# Patient Record
Sex: Female | Born: 1935 | Race: White | Hispanic: No | Marital: Married
Health system: Southern US, Community
[De-identification: ages and names within clinical notes are randomized; demographics above are authoritative.]

---

## 1999-10-10 ENCOUNTER — Encounter: Payer: Self-pay | Admitting: Neurosurgery

## 1999-10-12 ENCOUNTER — Encounter: Payer: Self-pay | Admitting: Neurosurgery

## 1999-10-12 ENCOUNTER — Inpatient Hospital Stay (HOSPITAL_COMMUNITY): Admission: RE | Admit: 1999-10-12 | Discharge: 1999-10-13 | Payer: Self-pay | Admitting: Neurosurgery

## 1999-11-14 ENCOUNTER — Encounter: Admission: RE | Admit: 1999-11-14 | Discharge: 1999-11-14 | Payer: Self-pay | Admitting: Neurosurgery

## 1999-11-14 ENCOUNTER — Encounter: Payer: Self-pay | Admitting: Neurosurgery

## 2003-11-16 ENCOUNTER — Ambulatory Visit: Payer: Self-pay | Admitting: Family Medicine

## 2003-11-20 ENCOUNTER — Other Ambulatory Visit: Payer: Self-pay

## 2003-11-20 ENCOUNTER — Inpatient Hospital Stay: Payer: Self-pay | Admitting: Unknown Physician Specialty

## 2003-11-25 ENCOUNTER — Ambulatory Visit: Payer: Self-pay | Admitting: Internal Medicine

## 2004-07-31 ENCOUNTER — Ambulatory Visit: Payer: Self-pay | Admitting: Family Medicine

## 2004-11-14 ENCOUNTER — Other Ambulatory Visit: Payer: Self-pay

## 2004-11-14 ENCOUNTER — Emergency Department: Payer: Self-pay | Admitting: Unknown Physician Specialty

## 2005-01-29 ENCOUNTER — Ambulatory Visit: Payer: Self-pay | Admitting: Urology

## 2005-02-06 ENCOUNTER — Ambulatory Visit: Payer: Self-pay | Admitting: Urology

## 2005-03-13 ENCOUNTER — Ambulatory Visit: Payer: Self-pay | Admitting: Gastroenterology

## 2005-04-18 ENCOUNTER — Ambulatory Visit: Payer: Self-pay | Admitting: Family Medicine

## 2005-04-22 ENCOUNTER — Ambulatory Visit: Payer: Self-pay | Admitting: Family Medicine

## 2005-05-22 ENCOUNTER — Ambulatory Visit: Payer: Self-pay | Admitting: Family Medicine

## 2005-06-22 ENCOUNTER — Ambulatory Visit: Payer: Self-pay | Admitting: Family Medicine

## 2005-10-09 ENCOUNTER — Ambulatory Visit: Payer: Self-pay | Admitting: Family Medicine

## 2006-04-24 ENCOUNTER — Observation Stay: Payer: Self-pay | Admitting: Internal Medicine

## 2006-04-24 ENCOUNTER — Other Ambulatory Visit: Payer: Self-pay

## 2006-10-16 ENCOUNTER — Ambulatory Visit: Payer: Self-pay | Admitting: Family Medicine

## 2007-07-20 ENCOUNTER — Ambulatory Visit: Payer: Self-pay | Admitting: Internal Medicine

## 2007-10-21 ENCOUNTER — Ambulatory Visit: Payer: Self-pay | Admitting: Family Medicine

## 2007-11-18 ENCOUNTER — Ambulatory Visit: Payer: Self-pay | Admitting: Unknown Physician Specialty

## 2008-01-01 ENCOUNTER — Ambulatory Visit: Payer: Self-pay | Admitting: Cardiology

## 2008-01-05 ENCOUNTER — Ambulatory Visit: Payer: Self-pay | Admitting: Cardiology

## 2008-01-30 ENCOUNTER — Ambulatory Visit: Payer: Self-pay | Admitting: Unknown Physician Specialty

## 2008-02-02 ENCOUNTER — Inpatient Hospital Stay: Payer: Self-pay | Admitting: Internal Medicine

## 2008-02-14 ENCOUNTER — Ambulatory Visit: Payer: Self-pay | Admitting: Internal Medicine

## 2008-02-19 ENCOUNTER — Inpatient Hospital Stay: Payer: Self-pay | Admitting: Specialist

## 2008-02-19 ENCOUNTER — Ambulatory Visit: Payer: Self-pay | Admitting: Family Medicine

## 2008-02-26 ENCOUNTER — Inpatient Hospital Stay: Payer: Self-pay | Admitting: Internal Medicine

## 2008-03-30 ENCOUNTER — Ambulatory Visit: Payer: Self-pay | Admitting: Urology

## 2008-11-08 ENCOUNTER — Ambulatory Visit: Payer: Self-pay | Admitting: Family Medicine

## 2009-09-30 ENCOUNTER — Ambulatory Visit: Payer: Self-pay | Admitting: Unknown Physician Specialty

## 2009-11-18 ENCOUNTER — Ambulatory Visit: Payer: Self-pay | Admitting: Family Medicine

## 2010-08-01 ENCOUNTER — Emergency Department: Payer: Self-pay | Admitting: *Deleted

## 2010-08-04 ENCOUNTER — Other Ambulatory Visit: Payer: Self-pay

## 2010-09-08 ENCOUNTER — Ambulatory Visit: Payer: Self-pay | Admitting: Gastroenterology

## 2010-11-19 IMAGING — US US RENAL KIDNEY
1 series · 17 of 25 positions shown · non-contrast
Comparison: none

REASON FOR EXAM: hx of hydronephrosis
COMMENTS:

[Series 1: us renal kidney · 17 of 27 slices shown]
[im 1/27]
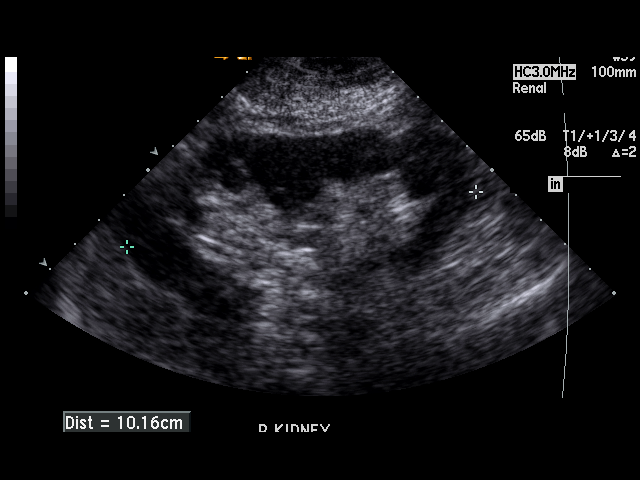
[im 3/27]
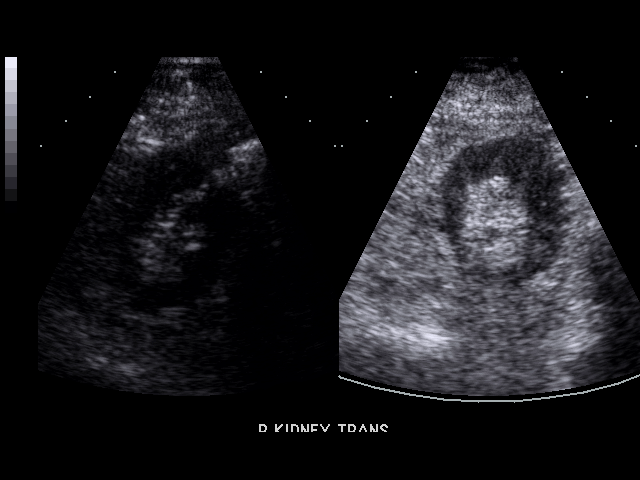
[im 4/27]
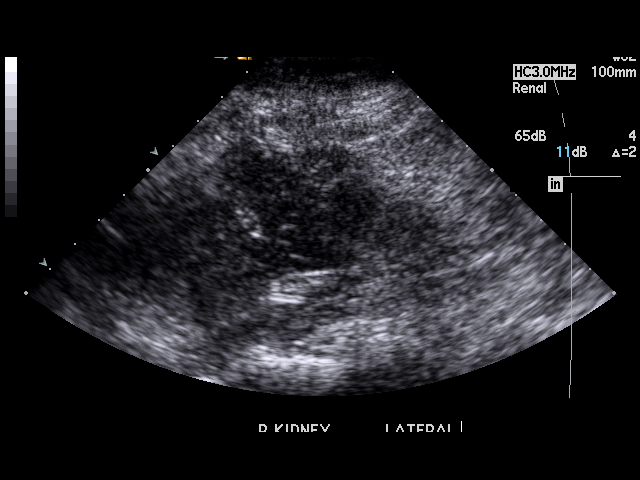
[im 6/27]
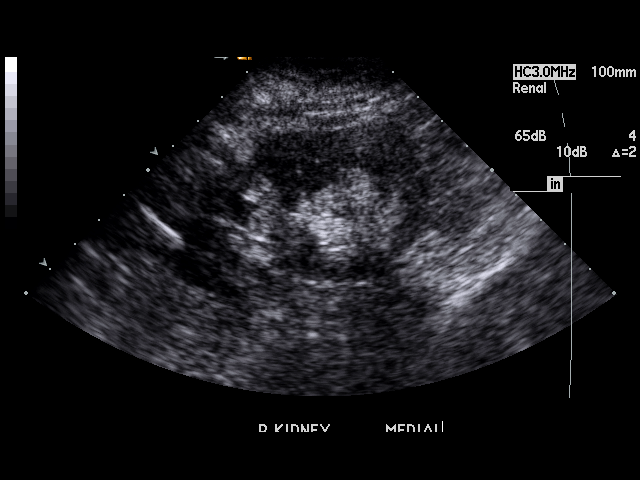
[im 7/27]
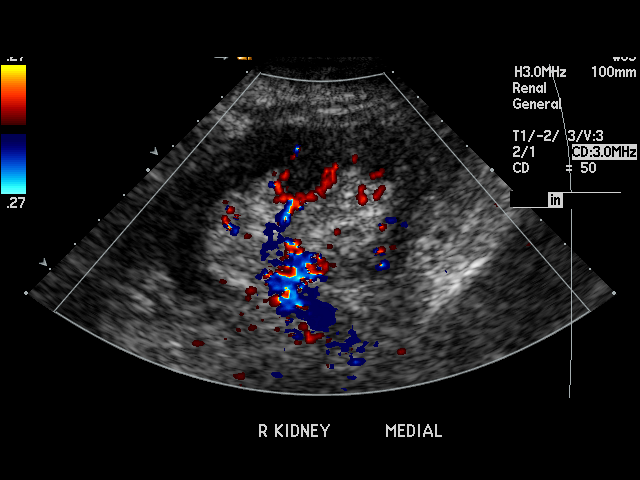
[im 9/27]
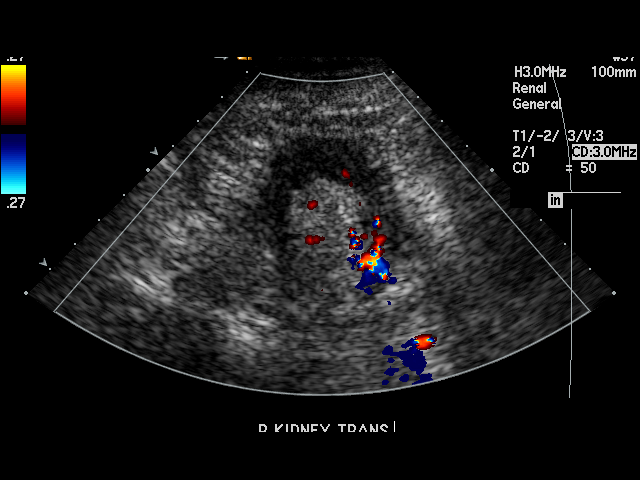
[im 10/27]
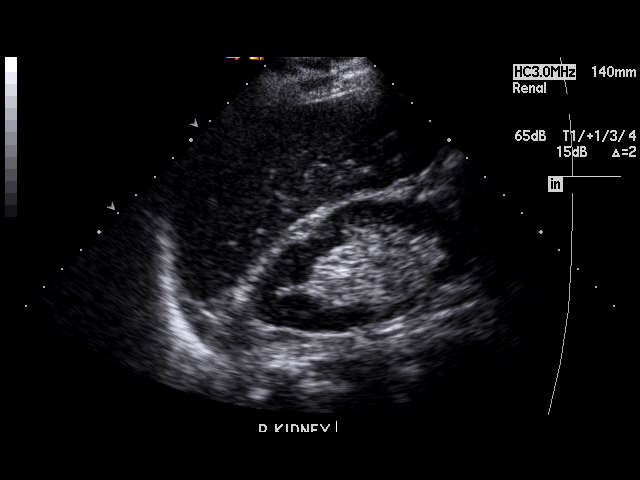
[im 12/27]
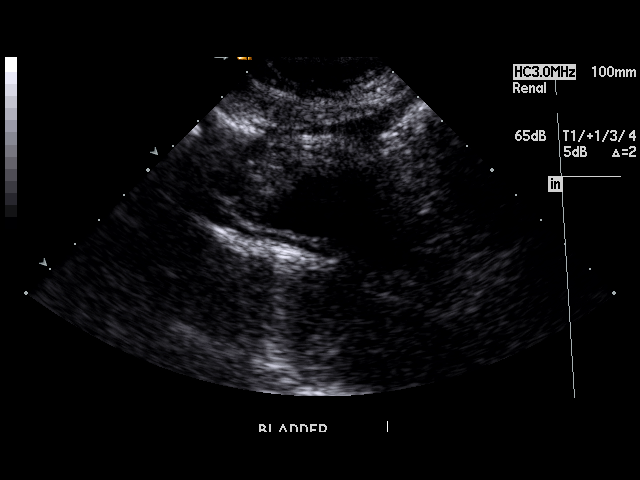
[im 14/27]
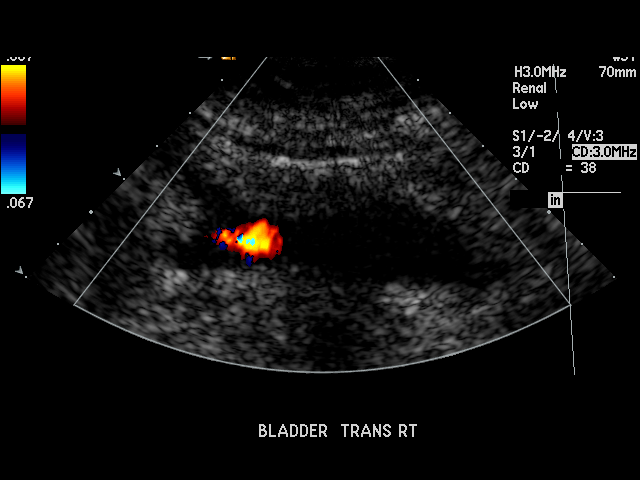
[im 15/27]
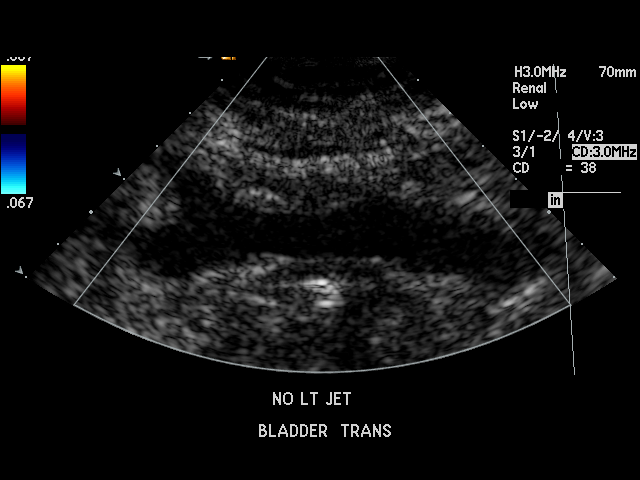
[im 17/27]
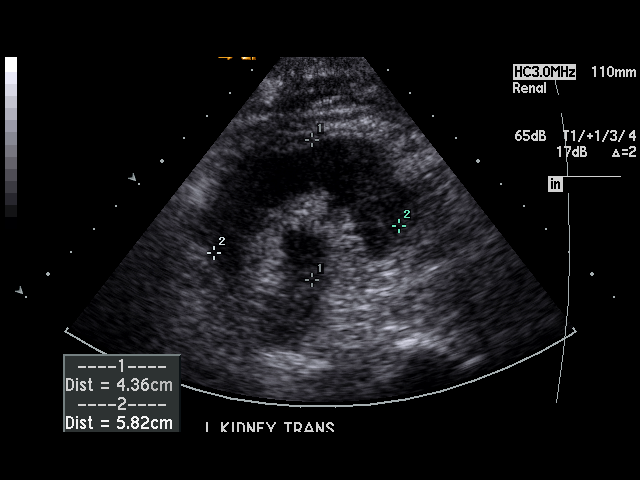
[im 18/27]
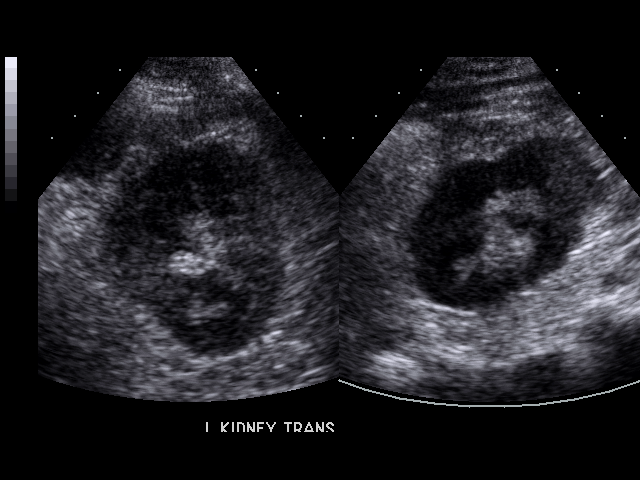
[im 20/27]
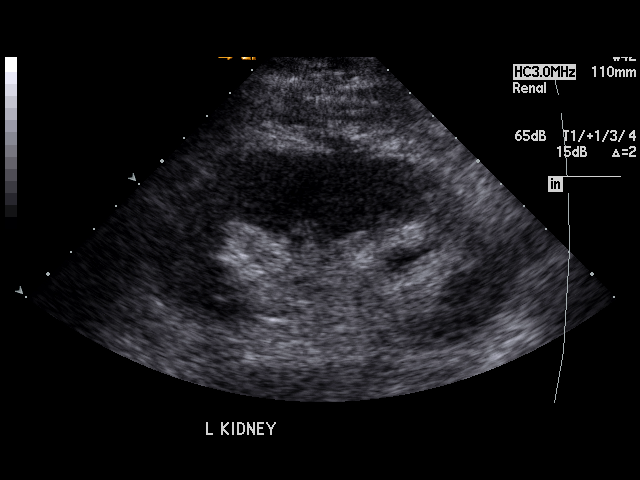
[im 21/27]
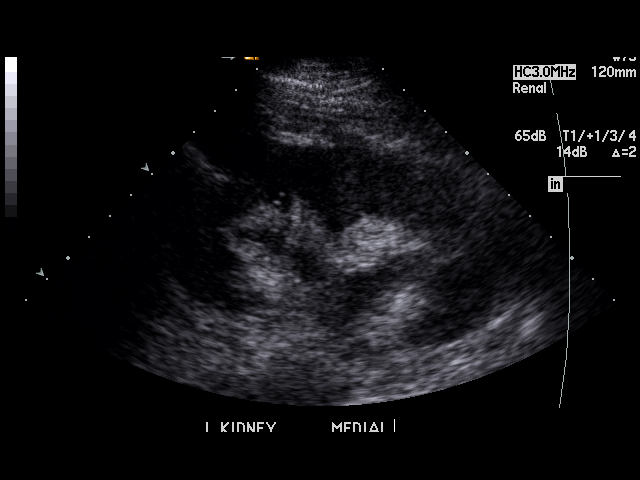
[im 23/27]
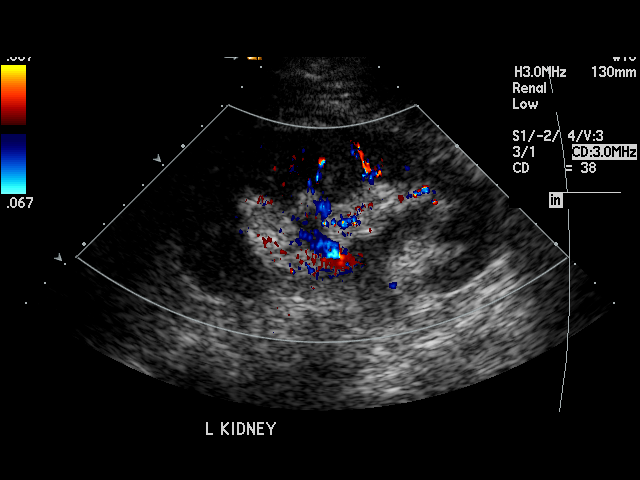
[im 24/27]
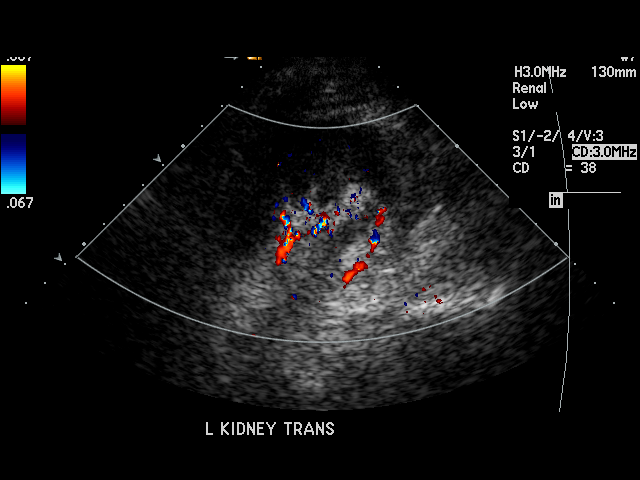
[im 27/27]
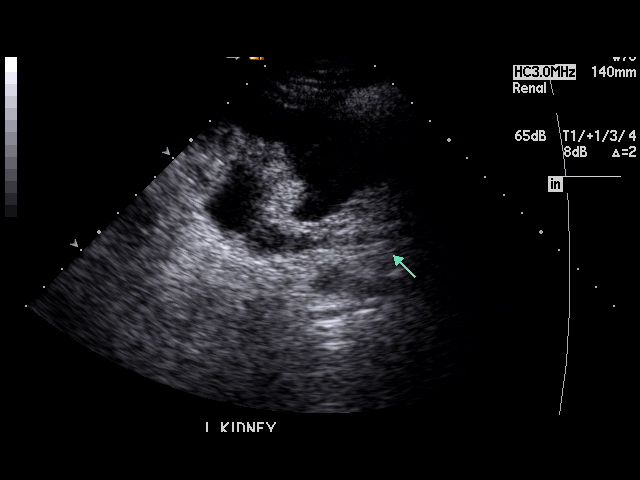

[17 of 25 positions shown; findings below may reference images not displayed]

PROCEDURE:     US  - US KIDNEY  - February 26, 2008  [DATE]

RESULT:     The RIGHT kidney measures 10.16 x 3.92 x 4.57 cm. There is
appropriate corticomedullary differentiation without evidence of
hydronephrosis, masses or calculi. This study was compared to previous study
dated 02/05/2008.

The LEFT kidney measures 11.13 x 4.36 x 5.82 cm. Moderate hydronephrosis is
appreciated involving the LEFT kidney which when compared to previous study
is unchanged. The urinary bladder is mildly distended. There is no evidence
of masses or calculi involving the LEFT kidney. There is mild cortical
thinning involving the LEFT kidney.
IMPRESSION: 1. Persistent hydronephrosis involving the LEFT kidney when compared to
previous study dated 02/05/2008.
[DATE]. Unremarkable RIGHT kidney.

El Houssein Mahebola of the Emergency Department was informed of these findings
at the time of the initial interpretation.

## 2010-11-19 IMAGING — CR DG CHEST 2V
1 series · 2 of 2 positions shown · non-contrast
Comparison: none

REASON FOR EXAM: fever
COMMENTS:

[Series 1: view not recorded · 0.17mm/px · 2 of 2 slices shown]
[im 1/2]
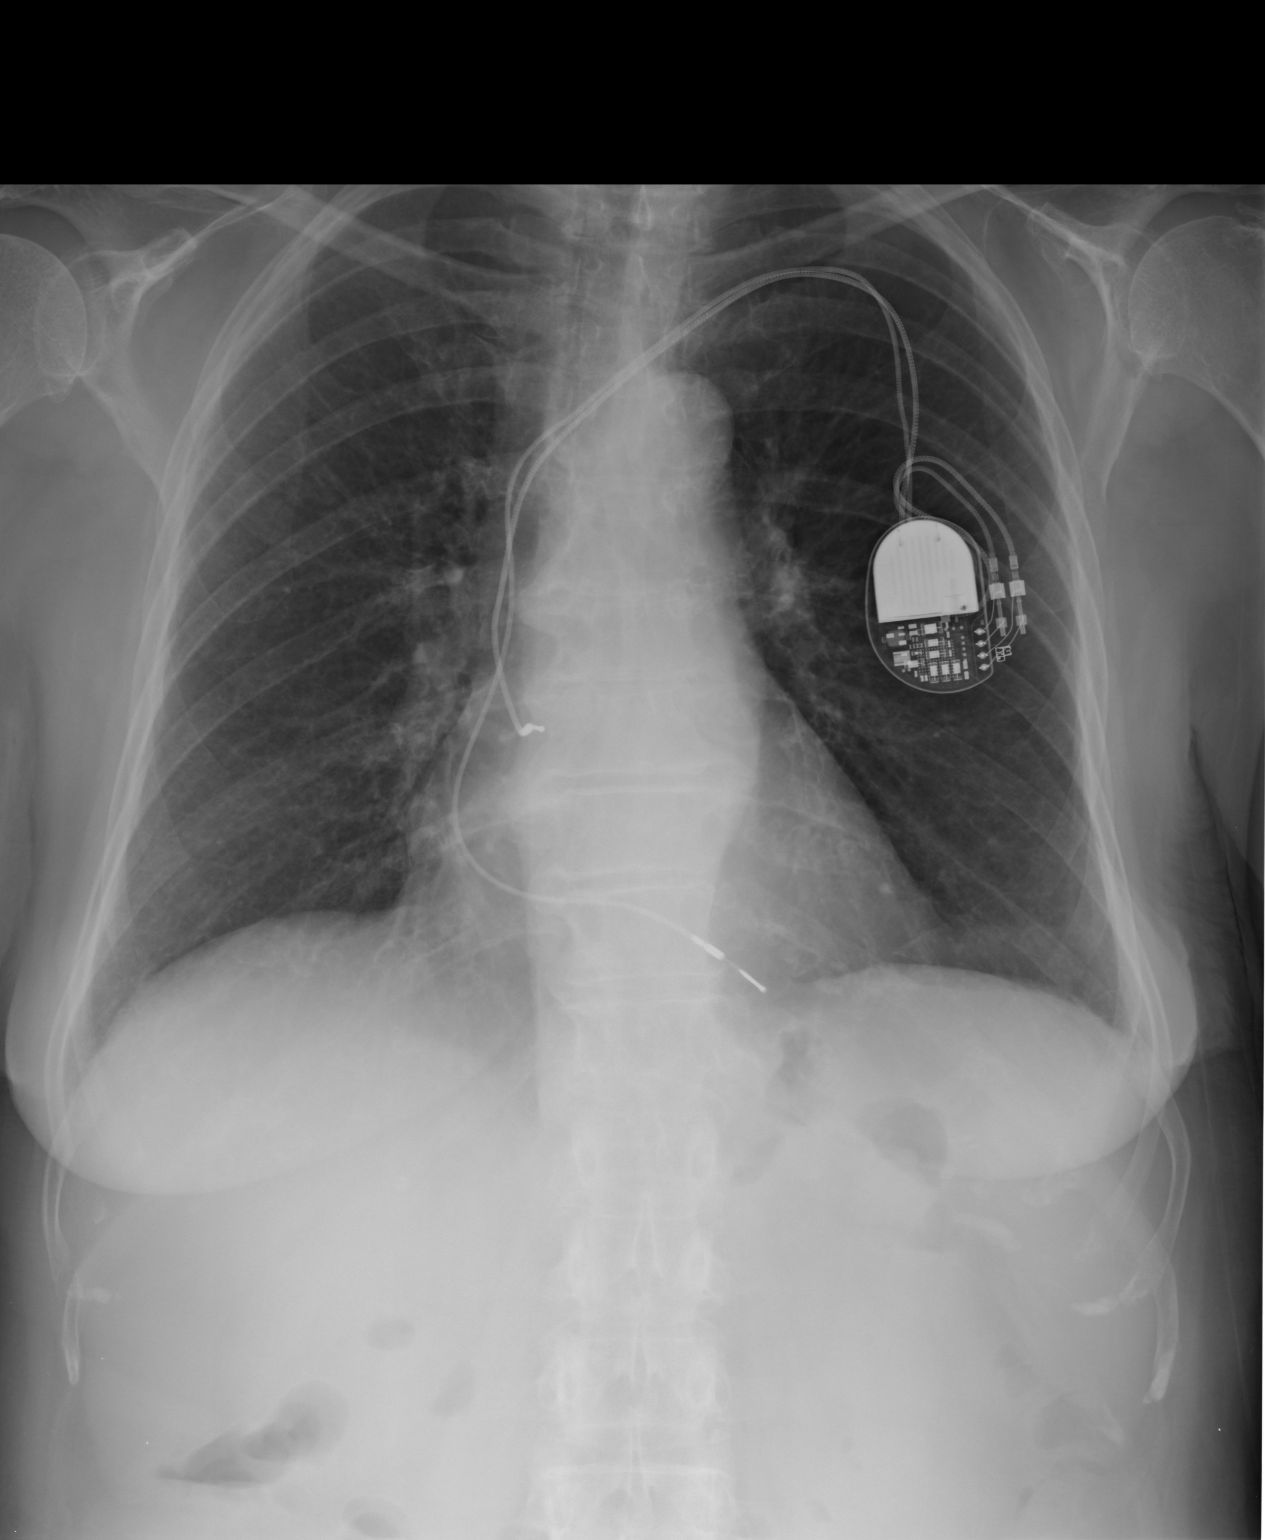
[im 2/2]
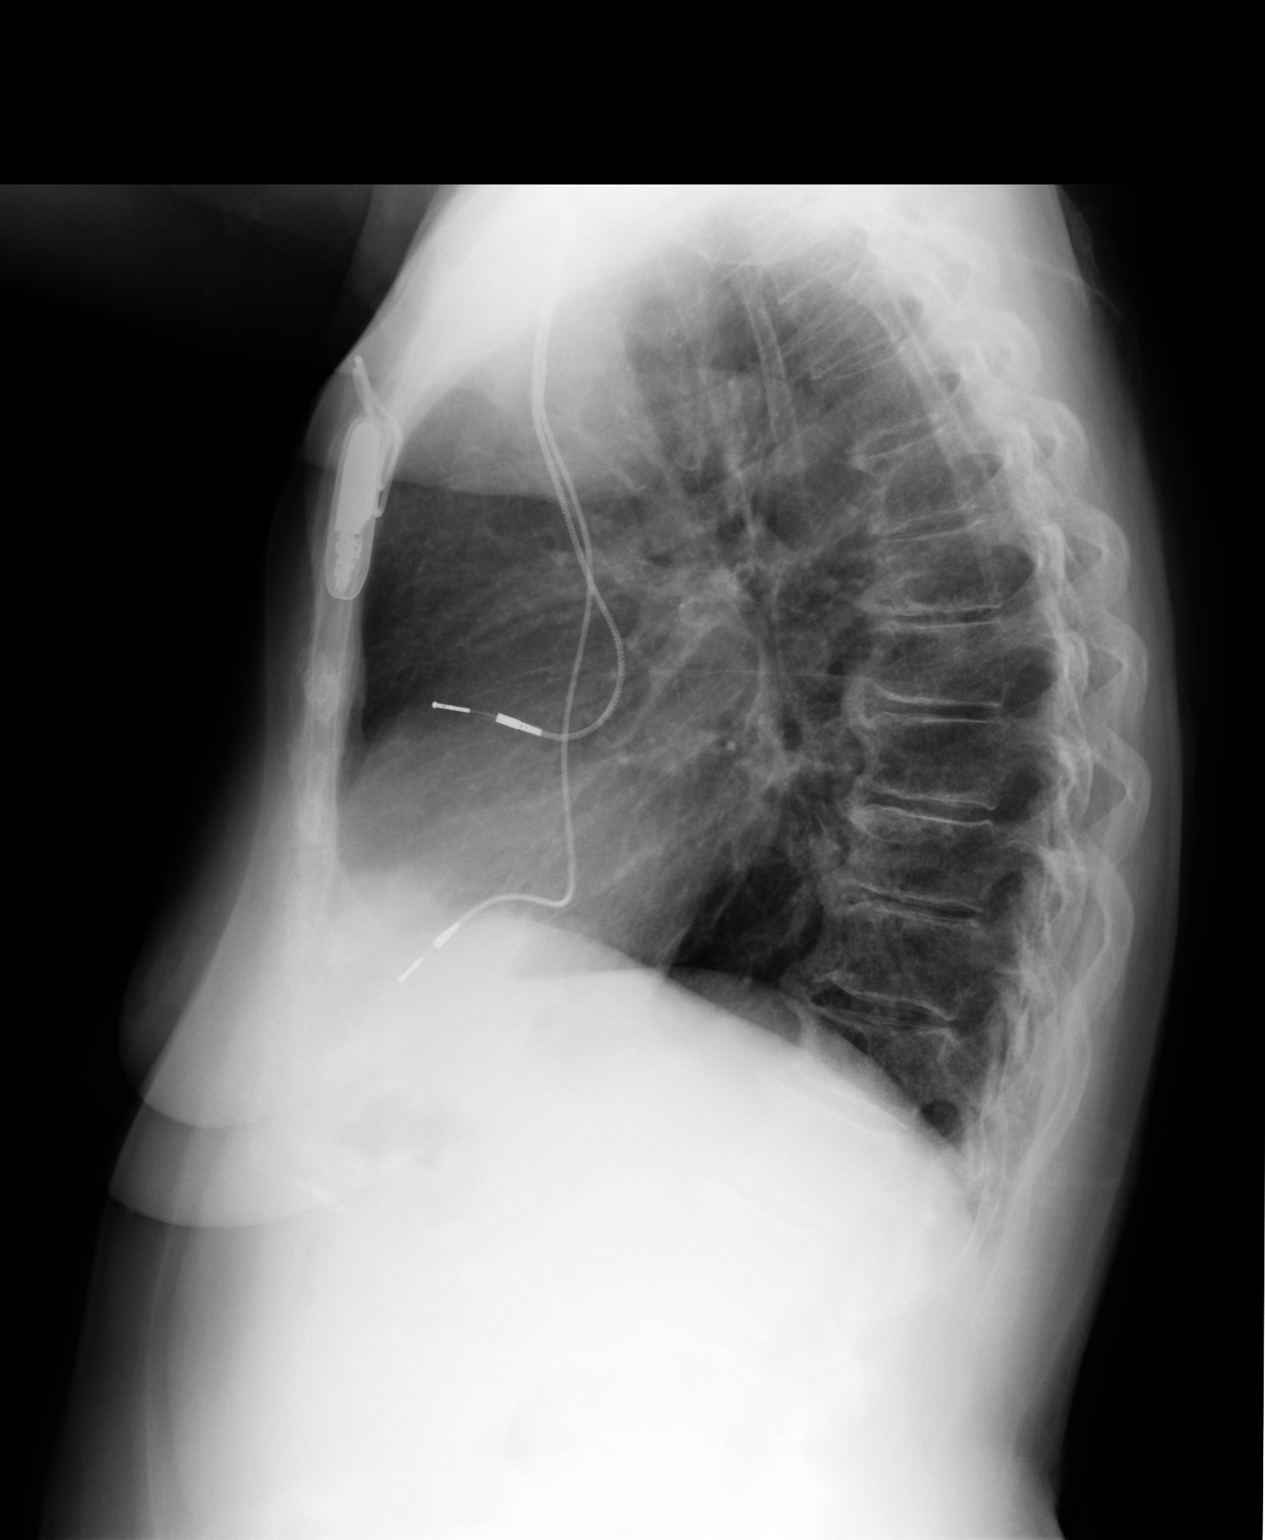

[2 of 2 positions shown; findings below may reference images not displayed]

PROCEDURE:     DXR - DXR CHEST PA (OR AP) AND LATERAL  - February 26, 2008  [DATE]

RESULT:     Comparison is made to prior study dated 02/10/2008. There is
flattening of the hemidiaphragms with increased AP diameter of the chest. No
focal regions of consolidation or focal infiltrates are appreciated. A
LEFT-sided pectoralis pacing unit is appreciated with lead tip projecting in
the region of the RIGHT atrium and the RIGHT ventricle. The visualized bony
skeleton is grossly unremarkable.
IMPRESSION: 1.     Chest radiograph without evidence of acute cardiopulmonary disease.

## 2010-11-21 ENCOUNTER — Ambulatory Visit: Payer: Self-pay | Admitting: Family Medicine

## 2010-11-23 ENCOUNTER — Ambulatory Visit: Payer: Self-pay | Admitting: Family Medicine

## 2011-01-01 ENCOUNTER — Emergency Department: Payer: Self-pay | Admitting: Emergency Medicine

## 2012-02-05 ENCOUNTER — Ambulatory Visit: Payer: Self-pay | Admitting: Family Medicine

## 2012-04-01 ENCOUNTER — Observation Stay: Payer: Self-pay | Admitting: Internal Medicine

## 2012-04-01 LAB — CBC WITH DIFFERENTIAL/PLATELET
Basophil #: 0.1 10*3/uL (ref 0.0–0.1)
Basophil %: 0.7 %
Eosinophil #: 0.1 10*3/uL (ref 0.0–0.7)
Eosinophil %: 1.6 %
HCT: 37.2 % (ref 35.0–47.0)
HGB: 12.5 g/dL (ref 12.0–16.0)
Lymphocyte #: 1.6 10*3/uL (ref 1.0–3.6)
Lymphocyte %: 21.4 %
MCH: 30.5 pg (ref 26.0–34.0)
MCHC: 33.5 g/dL (ref 32.0–36.0)
MCV: 91 fL (ref 80–100)
Monocyte #: 0.9 x10 3/mm (ref 0.2–0.9)
Monocyte %: 12.1 %
Neutrophil #: 4.9 10*3/uL (ref 1.4–6.5)
Neutrophil %: 64.2 %
Platelet: 173 10*3/uL (ref 150–440)
RBC: 4.09 10*6/uL (ref 3.80–5.20)
RDW: 13.9 % (ref 11.5–14.5)
WBC: 7.6 10*3/uL (ref 3.6–11.0)

## 2012-04-01 LAB — APTT: Activated PTT: 41.3 secs — ABNORMAL HIGH (ref 23.6–35.9)

## 2012-04-01 LAB — COMPREHENSIVE METABOLIC PANEL
Albumin: 3.9 g/dL (ref 3.4–5.0)
Alkaline Phosphatase: 75 U/L (ref 50–136)
Anion Gap: 6 — ABNORMAL LOW (ref 7–16)
BUN: 19 mg/dL — ABNORMAL HIGH (ref 7–18)
Bilirubin,Total: 0.4 mg/dL (ref 0.2–1.0)
Calcium, Total: 9.4 mg/dL (ref 8.5–10.1)
Chloride: 101 mmol/L (ref 98–107)
Co2: 29 mmol/L (ref 21–32)
Creatinine: 1.29 mg/dL (ref 0.60–1.30)
EGFR (African American): 44 — ABNORMAL LOW
EGFR (Non-African Amer.): 38 — ABNORMAL LOW
Glucose: 183 mg/dL — ABNORMAL HIGH (ref 65–99)
Osmolality: 279 (ref 275–301)
Potassium: 3.7 mmol/L (ref 3.5–5.1)
SGOT(AST): 24 U/L (ref 15–37)
SGPT (ALT): 18 U/L (ref 12–78)
Sodium: 136 mmol/L (ref 136–145)
Total Protein: 7.8 g/dL (ref 6.4–8.2)

## 2012-04-01 LAB — PROTIME-INR
INR: 3.2
Prothrombin Time: 31.8 secs — ABNORMAL HIGH (ref 11.5–14.7)

## 2012-04-01 LAB — TROPONIN I: Troponin-I: 0.02 ng/mL

## 2012-04-01 LAB — CK TOTAL AND CKMB (NOT AT ARMC)
CK, Total: 149 U/L (ref 21–215)
CK-MB: 1.5 ng/mL (ref 0.5–3.6)

## 2012-04-02 LAB — BASIC METABOLIC PANEL
BUN: 16 mg/dL (ref 7–18)
Calcium, Total: 8.6 mg/dL (ref 8.5–10.1)
Chloride: 107 mmol/L (ref 98–107)
EGFR (African American): 52 — ABNORMAL LOW
EGFR (Non-African Amer.): 44 — ABNORMAL LOW
Osmolality: 286 (ref 275–301)
Potassium: 3.7 mmol/L (ref 3.5–5.1)
Sodium: 140 mmol/L (ref 136–145)

## 2012-08-04 ENCOUNTER — Ambulatory Visit: Payer: Self-pay | Admitting: Family Medicine

## 2012-12-22 DEATH — deceased

## 2014-05-14 NOTE — H&P (Signed)
PATIENT NAME:  [name], [name] DATE OF BIRTH:  10/20/1926  DATE OF ADMISSION:  04/01/2012  PRIMARY CARE PHYSICIAN:  Katrina IvanKanhka Linthavong, [facility] at [facility].   CHIEF COMPLAINT: Chest pain.   HISTORY OF PRESENT ILLNESS:  The patient is an 81 year old pleasant, white female with a past medical history of dementia, congestive heart failure with ejection fraction of 35%, cardiomyopathy, atrial fibrillation, diabetes mellitus, who presented to the Emergency Department with complaints of chest pain that started around 5:30 in the morning. The patient woke up with complaints of chest pain in the center of the chest, a pressure-like pain radiating to the right side. This was associated with shortness of breath, some nausea and lightheaded.  The patient has been experiencing this light headedness for the last few days.  The patient's husband passed away last month.  Since then she has not been drinking enough fluids and also worried about if she goes out she may not return home. Per family, drinks lots of Sunkist and eats a lot of candy. Having cough for the last few days. Consequently, this patient was brought to the Emergency Department. Per family the patient was in respiratory distress at that time. Work-up in the Emergency Department with EKG and cardiac enzymes were unremarkable.   PAST MEDICAL HISTORY:  1.  Congestive heart failure secondary to left ventricular dysfunction with ejection fraction of 35%.  2.  Cardiomyopathy.  3.  Atrial fibrillation.  4.  Coagulopathy. 5.  Hypertension.  6.  Type 2 diabetes mellitus. The patient denied having any previous episodes of MI and never had any left heart catheterization done. The patient had multiple stress tests done. The family does not remember the exact time of the test.   ALLERGIES: No known drug allergies.   HOME MEDICATIONS:  1.  Coumadin 3 mg 5 times a week and 2 mg 2 times a day.  2.  Tylenol arthritis daily.  3.  Potassium  chloride 10 mEq daily.  4.  Percocet 1 tablet every six hours as needed.  5.  Metoprolol 100 mg 1 tablet daily.  6.  Lipitor 10 mg daily.  7.  Lasix 20 mg daily.  8.  Diovan 80 mg daily.  9.  Digoxin 0.25 mg oral daily.   PAST SURGICAL HISTORY:  1.  Total abdominal hysterectomy.  2.  Left knee replacement.  3.  Right knee replacement.  4.  Pacemaker placement.  5.  Renal stone extraction.   SOCIAL HISTORY: No history of smoking, drinking alcohol or using illicit drugs. Currently lives with her family.   FAMILY HISTORY: History of diabetes mellitus.   REVIEW OF SYSTEMS: CONSTITUTIONAL: Generalized weakness and lightheadedness.  EYES: No change in vision. The ears have no change in hearing. No runny nose, sore throat. RESPIRATORY:  Shortness of breath.  CARDIOVASCULAR: Chest pain.  GASTROINTESTINAL: No nausea or vomiting or diarrhea. GENITOURINARY: No hesitancy or frequency of urination or hematuria.  ENDOCRINE: No polyuria or polydipsia.  SKIN: No rash or lesions. MUSCULOSKELETAL:  Has generalized body aches.  NEUROLOGIC: No weakness in any part of the body.  ENDOCRINE: No polyuria or polydipsia.   PHYSICAL EXAMINATION: GENERAL: This is a thin built, frail-looking female lying down in the bed, not in distress.  VITAL SIGNS: Temperature 97.8, pulse 68, blood pressure 130/55, respiratory rate of 24, oxygen saturations 98% on room air.   HEENT: Head normocephalic, atraumatic.  Sclerae and conjunctivae normal. Pupils equal and reactive to light. Mucous membranes dry.  NECK: Supple. No lymphadenopathy. No JVD. No carotid bruit.  CHEST: Has focal tenderness on the sternal area. LUNGS:  Bilaterally clear to auscultation.  HEART: S1, S2 regular, no murmurs are heard.  ABDOMEN: Bowel sounds present. Soft.  Has tenderness in the right upper quadrant epigastric area.   No rebound or guarding.  EXTREMITIES: No pedal edema. Pulses 2+.  NEUROLOGIC: The patient is alert, oriented to place,  person, but not to time.  No apparent cranial nerve abnormalities. No motor and sensory deficits.   LABORATORY AND DIAGNOSTIC DATA:  CBC is completely within normal limits.  CMP has blood sugars of 183, all the rest of the values are within normal limits. Cardiac enzymes are well within normal limits.  EKG, 12-lead normal sinus rhythm with no ST-T wave abnormalities.  Chest x-ray, one view portable, no acute cardiopulmonary disease.   ASSESSMENT AND PLAN: The patient is an 81 year old female comes to the Emergency Department with complaints of chest pain.    1.  Chest pain, seems to more musculoskeletal pain. Considering patient's multiple risk factors, admit the patient under observation. Continue to cycle cardiac enzymes x 3. If cardiac enzymes are negative, no need for further work-up.  2.  Right upper quadrant pain.  We will obtain right upper quadrant ultrasound considering the patient's nausea.  Sometimes this could be referring pain. 3.  Dizziness.  Very highly concerning about dehydration.  We will obtain orthostatic blood pressures and continue with intravenous fluids.  4.  Diabetes mellitus.  Keep the patient on sliding scale insulin.  5.  Hypertension, currently well controlled.  6.  Congestive heart failure.  We will hold the Lasix for now.  7.  Keep the patient on deep vein thrombosis prophylaxis.  The patient is already on therapeutic dose of Coumadin.    ____________________________ Katrina Griffins, MD pv:ct D: 04/01/2012 08:52:15 ET T: 04/01/2012 11:16:23 ET JOB#: [id number]  cc: Katrina Griffins, MD, <Dictator> Katrina Griffins MD ELECTRONICALLY SIGNED 04/02/2012 6:31

## 2014-05-14 NOTE — Discharge Summary (Signed)
PATIENT NAME:  [name], [name] MR#:  [id number] DATE OF BIRTH:  July 07, 1926  DATE OF ADMISSION:  04/01/2012 DATE OF DISCHARGE: 04/02/2012   DISCHARGE DIAGNOSES:  1.  Noncardiac chest pain, resolved.  2.  Right upper quadrant abdominal pain, resolved.  3.  Paroxysmal atrial fibrillation, on Coumadin. She has been on warfarin with sick sinus syndrome, status post pacemaker.  4.  Adult-onset diabetes.  5.  Chronic anxiety depressive disorder.  6.  Hypertension.  7.  Hyperlipidemia.  8.  Dementia.  9.  Gastroesophageal reflux disease.   DISCHARGE MEDICATIONS:  1.  Digoxin 250 mcg p.o. daily.  2.  Warfarin 2.5 mg p.o. daily.  3.  Vitamin B12 once a day.  4.  Vitamin D3 once a day.  5.  Furosemide 20 mg p.o. daily.  6.  Colestipol 1 gram t.i.d.  7.  Donepezil 5 mg p.o. daily.  8.  Omeprazole 20 mg p.o. daily.  9.  Trazodone 50 mg at bedtime as needed for insomnia.  10. Sertraline 25 mg p.o. daily.  11. Atorvastatin 10 mg p.o. at bedtime.  12. Glimepiride 4 mg p.o. b.i.d.  13. Metoprolol succinate 100 mg p.o. daily.  14. Diovan 40 mg p.o. daily.  15. Acetaminophen 325 mg 2 tabs p.o. every 4 hours as needed for pain. This is over-the-counter.   CONSULTS: None.   PROCEDURES: None.   PERTINENT LABORATORIES NAD STUDIES: On the day of discharge, the patient had a right upper quadrant ultrasound that was negative. No signs cholecystitis. Chest x-ray was negative. INR 3.2. Sodium 140, potassium 3.7, creatinine 1.14, glucose 187. LFTs within normal limits. Cardiac enzymes were negative x 1.   BRIEF HOSPITAL COURSE:  1.  Noncardiac chest pain: The patient came in complaining of chest discomfort. EKG only showed pacing, but no ST or T wave changes. Cardiac enzymes were negative x 1. Symptoms did resolve without any intervention. 2.  Right upper quadrant abdominal pain. The patient was noted to have abdominal discomfort in the ER and underwent an ultrasound that was negative. LFTs within  normal limits. Overall clinical status improved. No symptoms on the day of discharge. We will advance diet accordingly.  3.  Dementia. The patient has moderate dementia that is concerning for her medication compliance. Plan to have home health nurse evaluate her for this issue.  4.  Other chronic medical issues are stable at this time. Will continue home regimen.   DISPOSITION: She is in stable condition and being discharged to home. She will follow up with me in the clinic in 14 days or less.    ____________________________ Marisue IvanKanhka Linthavong, MD kl:aw D: 04/02/2012 08:01:00 ET T: 04/02/2012 08:33:32 ET JOB#: [id number]  cc: Marisue IvanKanhka Linthavong, MD, <Dictator> Marisue IvanKANHKA LINTHAVONG MD ELECTRONICALLY SIGNED 05/02/2012 10:00

## 2014-12-24 IMAGING — US ABDOMEN ULTRASOUND LIMITED
1 series · 13 of 25 positions shown · non-contrast
Comparison: none

REASON FOR EXAM: RUQ pain
COMMENTS:   Body Site: GB and Fossa, CBD, Head of Pancreas; Right Upper Quad

[Series 1: abdomen ultrasound limited · 0.25mm/px · 13 of 63 slices shown]
[im 1/63]
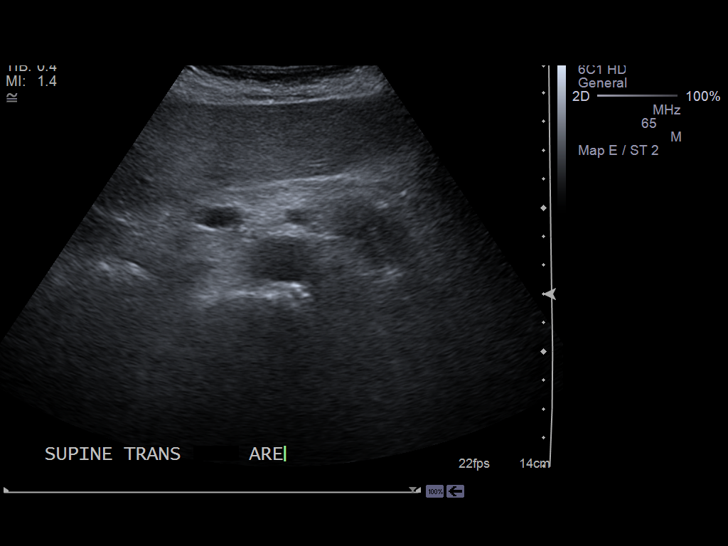
[im 6/63]
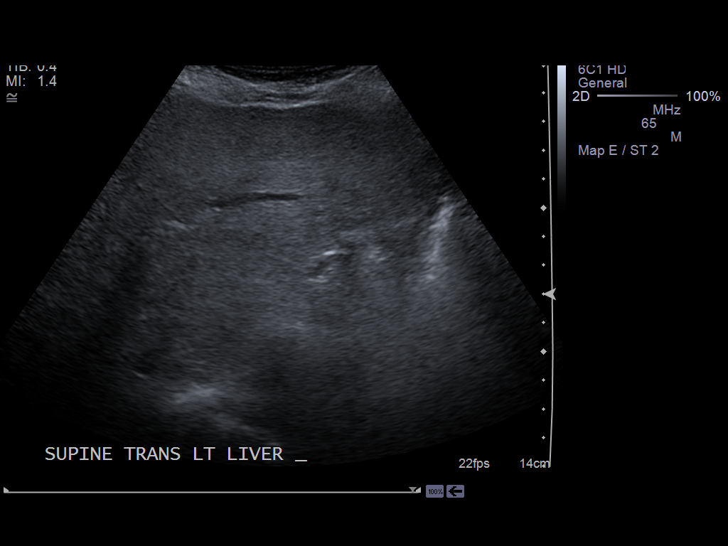
[im 11/63]
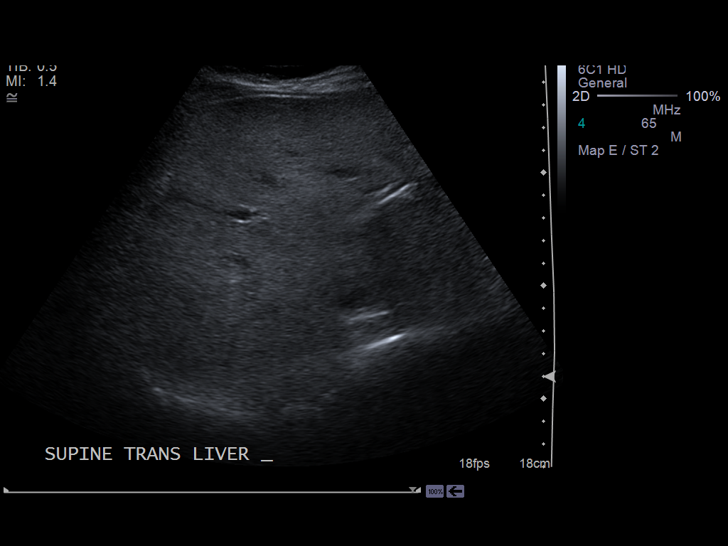
[im 16/63]
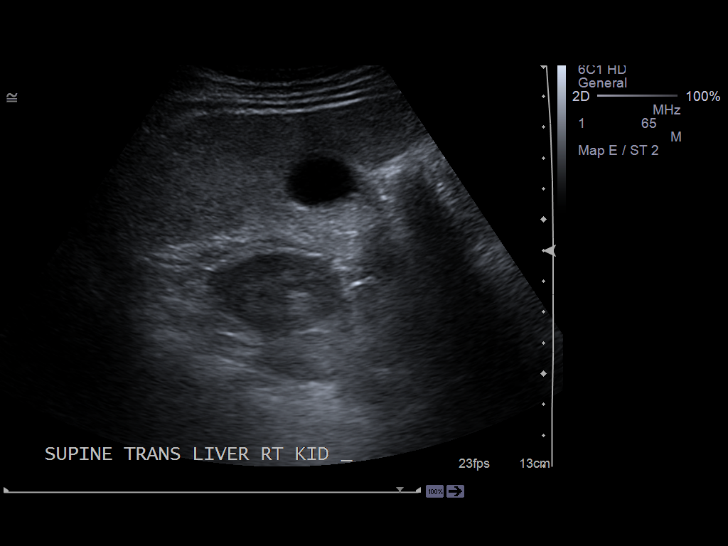
[im 21/63]
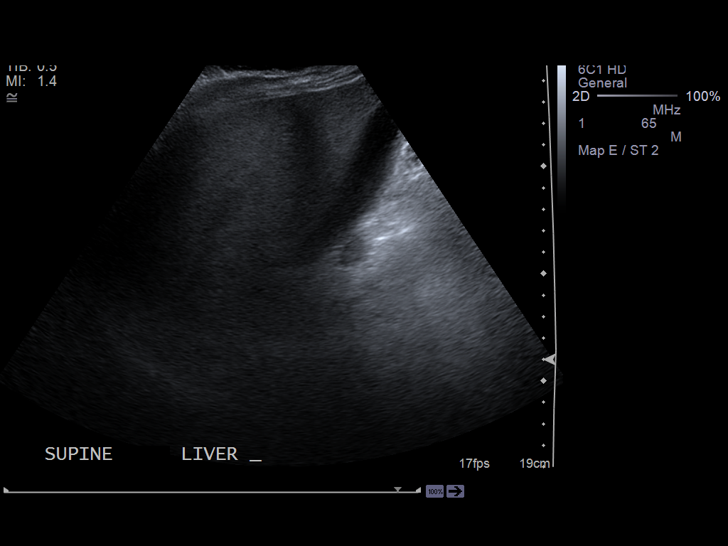
[im 26/63]
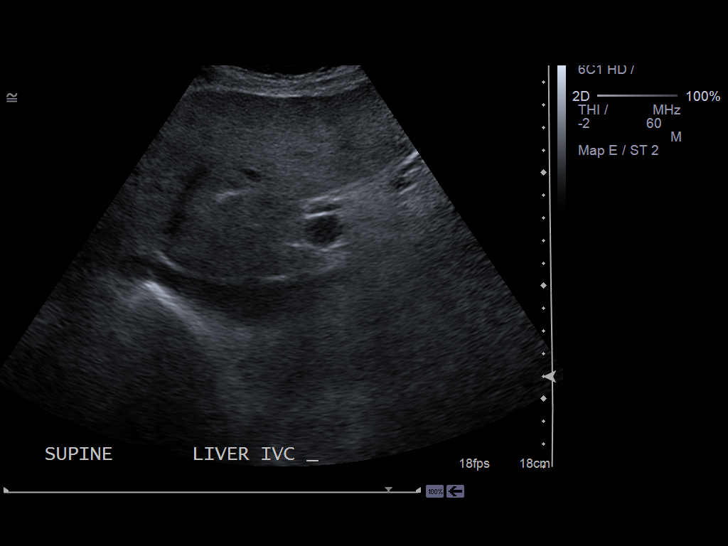
[im 32/63]
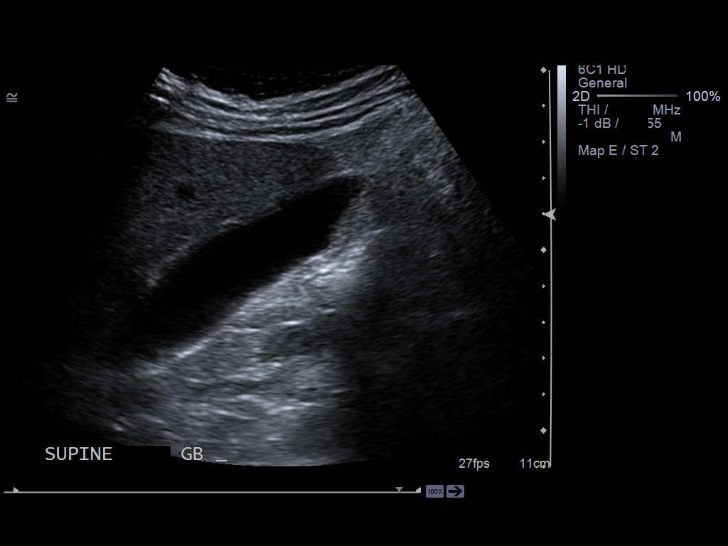
[im 37/63]
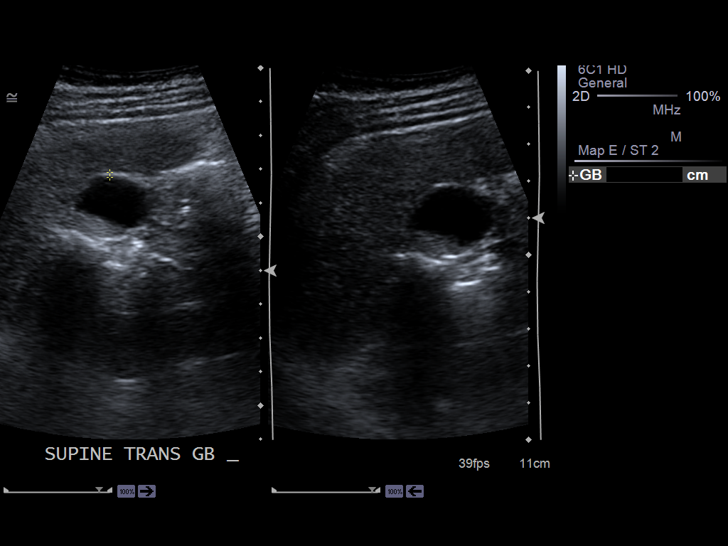
[im 42/63]
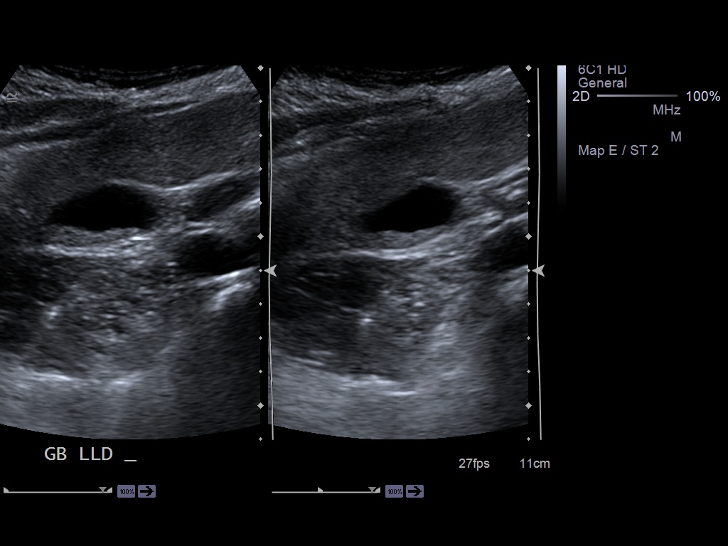
[im 47/63]
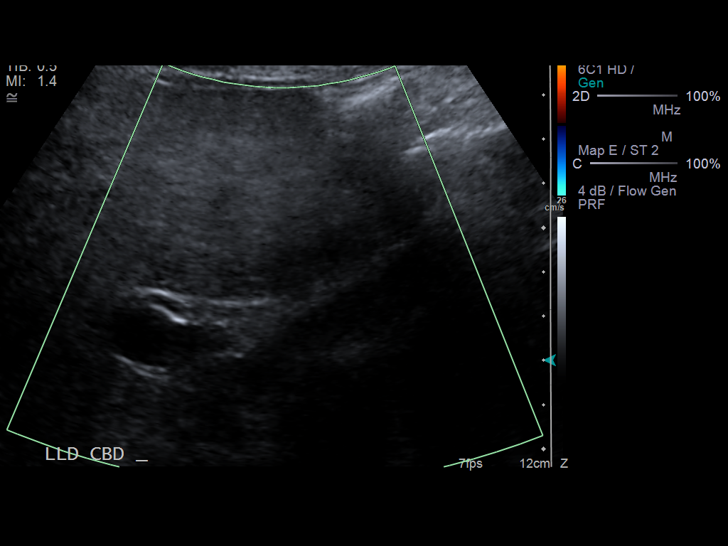
[im 52/63]
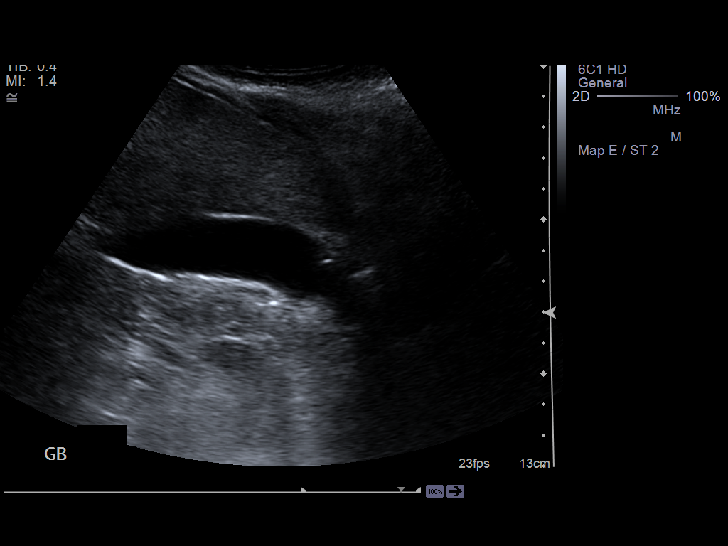
[im 57/63]
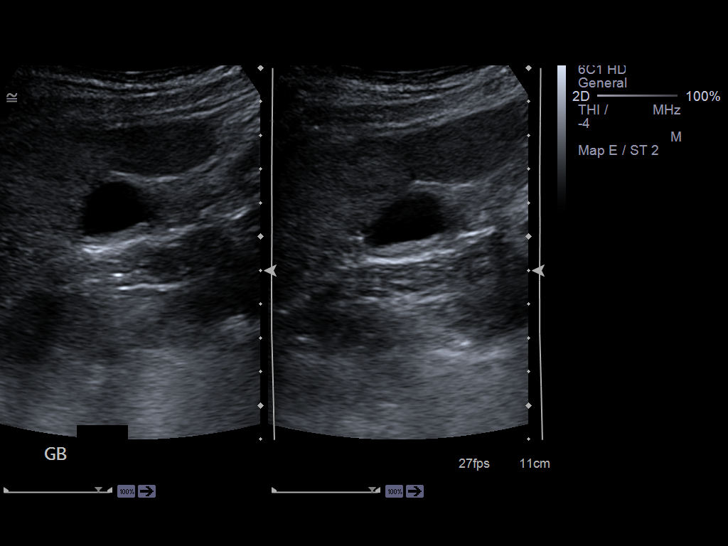
[im 63/63]
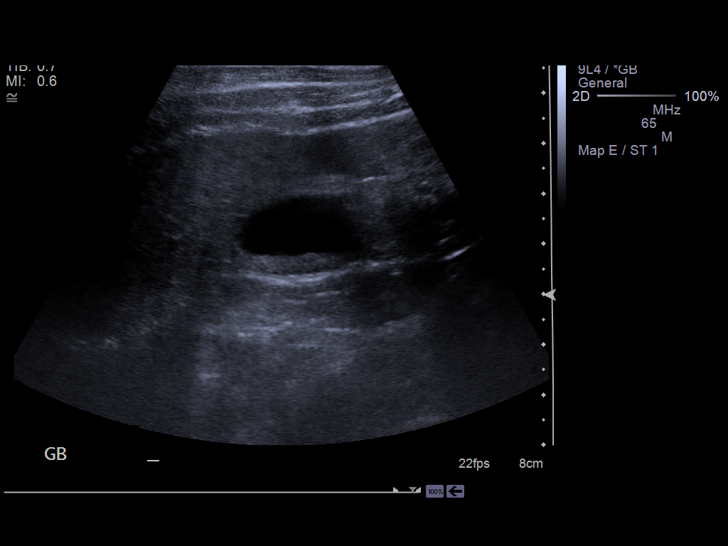

[13 of 25 positions shown; findings below may reference images not displayed]

PROCEDURE:     US  - US ABDOMEN LIMITED SURVEY  - April 01, 2012  [DATE]

RESULT:     Limited right upper quadrant abdominal sonogram is performed. Is
images of the area the pancreas show limited visualization without gross
abnormality. The hepatic echotexture is somewhat heterogeneous and increased
which could be secondary to chronic hepatocellular disease or diffuse fatty
infiltration. The portal venous flow is normal. The visualized proximal
inferior vena cava is patent. The visualized proximal aorta is normal.
Gallbladder wall thickness is 1.3 mm. No gallstones are evident. Common bile
duct diameter 3.7 mm. There does appear to be some dependent echogenic
material without shadowing in the gallbladder most consistent with sludge.
This is best appreciated near the fundus. There is no pericholecystic fluid.
The patient was slightly tender over the gallbladder region.
IMPRESSION: 1. No evidence of acute cholecystitis. There is some gallbladder sludge
present. The patient had tenderness over the right upper quadrant but this
was not sent severe that it appeared to suggest prominent positive
sonographic Murphy's sign. There is limited visualization of the pancreas
without a discrete mass. There is increased echotexture the liver consistent
with fatty infiltration. The liver length is 16.13 cm.

[REDACTED]
